# Patient Record
Sex: Female | Born: 1990 | Hispanic: No | Marital: Single | Smoking: Never smoker
Health system: Southern US, Community
[De-identification: ages and names within clinical notes are randomized; demographics above are authoritative.]

---

## 2016-11-10 ENCOUNTER — Encounter (HOSPITAL_COMMUNITY): Payer: Self-pay | Admitting: Emergency Medicine

## 2016-11-10 ENCOUNTER — Ambulatory Visit (HOSPITAL_COMMUNITY)
Admission: EM | Admit: 2016-11-10 | Discharge: 2016-11-10 | Disposition: A | Discharge status: Home / Self Care | Payer: PRIVATE HEALTH INSURANCE | Attending: Family Medicine | Admitting: Family Medicine

## 2016-11-10 DIAGNOSIS — M5432 Sciatica, left side: Secondary | ICD-10-CM | POA: Diagnosis not present

## 2016-11-10 MED ORDER — PREDNISONE 10 MG (21) PO TBPK: ORAL_TABLET | ORAL | 0 refills | Status: AC

## 2016-11-10 MED ORDER — CYCLOBENZAPRINE HCL 10 MG PO TABS: 10.0000 mg | ORAL_TABLET | Freq: Two times a day (BID) | ORAL | 0 refills | Status: AC | PRN

## 2016-11-10 NOTE — ED Triage Notes (Signed)
Pt here for left leg pain onset 6 days   Reports she heard a pop in her back when she was pulling a table at school  A&O x4... NAD... Ambulatory

## 2016-11-10 NOTE — ED Provider Notes (Signed)
  [facility]   [id number] 11/10/16 Arrival Time: 1229  ASSESSMENT & PLAN:  1. Sciatica of left side     Meds ordered this encounter  Medications  . predniSONE (STERAPRED UNI-PAK 21 TAB) 10 MG (21) TBPK tablet    Sig: Take 6-5-4-3-2-1 po qd    Dispense:  21 tablet    Refill:  0    Order Specific Question:   Supervising Provider    AnswerMardella Layman [[id number]]  . cyclobenzaprine (FLEXERIL) 10 MG tablet    Sig: Take 1 tablet (10 mg total) by mouth 2 (two) times daily as needed for muscle spasms.    Dispense:  20 tablet    Refill:  0    Order Specific Question:   Supervising Provider    Answer:   Mardella Layman [[id number]]    Reviewed expectations re: course of current medical issues. Questions answered. Outlined signs and symptoms indicating need for more acute intervention. Patient verbalized understanding. After Visit Summary given.   SUBJECTIVE:  Alexa Foster is a 26 y.o. female who presents with complaint of left back pain radiating down her left buttock to left knee.  ROS: As per HPI.   OBJECTIVE:  Vitals:   11/10/16 1309  BP: (!) 98/58  Pulse: 71  Resp: 16  Temp: 98.5 F (36.9 C)  TempSrc: Oral  SpO2: 100%     General appearance: alert; no distress Eyes: PERRLA; EOMI; conjunctiva normal HENT: normocephalic; atraumatic; TMs normal; nasal mucosa normal; oral mucosa normal Neck: supple Lungs: clear to auscultation bilaterally Heart: regular rate and rhythm Abdomen: soft, non-tender; bowel sounds normal; no masses or organomegaly; no guarding or rebound tenderness Back: no CVA tenderness Extremities: no cyanosis or edema; symmetrical with no gross deformities,  TTP left sciatic notch and neg SLR Skin: warm and dry Neurologic: normal gait; normal symmetric reflexes Psychological: alert and cooperative; normal mood and affect  History reviewed. No pertinent past medical history.   has no past medical history on file.  No results found  for this or any previous visit.  Labs Reviewed - No data to display  Imaging: No results found.  Allergies  Allergen Reactions  . Lactose Intolerance (Gi)     History reviewed. No pertinent family history. History reviewed. No pertinent surgical history.       Deatra Canter, FNP 11/10/16 1426

## 2017-07-09 ENCOUNTER — Other Ambulatory Visit: Payer: Self-pay

## 2017-07-09 ENCOUNTER — Emergency Department (HOSPITAL_COMMUNITY): Payer: PRIVATE HEALTH INSURANCE

## 2017-07-09 ENCOUNTER — Emergency Department (HOSPITAL_COMMUNITY)
Admission: EM | Admit: 2017-07-09 | Discharge: 2017-07-09 | Disposition: A | Discharge status: Home / Self Care | Payer: PRIVATE HEALTH INSURANCE | Attending: Emergency Medicine | Admitting: Emergency Medicine

## 2017-07-09 ENCOUNTER — Encounter (HOSPITAL_COMMUNITY): Payer: Self-pay | Admitting: Emergency Medicine

## 2017-07-09 DIAGNOSIS — R110 Nausea: Secondary | ICD-10-CM | POA: Diagnosis not present

## 2017-07-09 DIAGNOSIS — R1012 Left upper quadrant pain: Secondary | ICD-10-CM | POA: Diagnosis not present

## 2017-07-09 DIAGNOSIS — R140 Abdominal distension (gaseous): Secondary | ICD-10-CM | POA: Diagnosis not present

## 2017-07-09 DIAGNOSIS — R1013 Epigastric pain: Secondary | ICD-10-CM | POA: Insufficient documentation

## 2017-07-09 LAB — URINALYSIS, ROUTINE W REFLEX MICROSCOPIC
BACTERIA UA: NONE SEEN
Bilirubin Urine: NEGATIVE
Glucose, UA: NEGATIVE mg/dL
KETONES UR: 20 mg/dL — AB
Leukocytes, UA: NEGATIVE
Nitrite: NEGATIVE
PROTEIN: NEGATIVE mg/dL
Specific Gravity, Urine: 1.01 (ref 1.005–1.030)
pH: 5 (ref 5.0–8.0)

## 2017-07-09 LAB — COMPREHENSIVE METABOLIC PANEL
ALBUMIN: 4.1 g/dL (ref 3.5–5.0)
ALT: 9 U/L — ABNORMAL LOW (ref 14–54)
ANION GAP: 10 (ref 5–15)
AST: 15 U/L (ref 15–41)
Alkaline Phosphatase: 62 U/L (ref 38–126)
BUN: 6 mg/dL (ref 6–20)
CHLORIDE: 107 mmol/L (ref 101–111)
CO2: 23 mmol/L (ref 22–32)
Calcium: 8.9 mg/dL (ref 8.9–10.3)
Creatinine, Ser: 0.58 mg/dL (ref 0.44–1.00)
GFR calc Af Amer: >60 mL/min (ref >60)
GFR calc non Af Amer: >60 mL/min (ref >60)
GLUCOSE: 99 mg/dL (ref 65–99)
POTASSIUM: 3.4 mmol/L — AB (ref 3.5–5.1)
SODIUM: 140 mmol/L (ref 135–145)
Total Bilirubin: 0.4 mg/dL (ref 0.3–1.2)
Total Protein: 7.5 g/dL (ref 6.5–8.1)

## 2017-07-09 LAB — I-STAT BETA HCG BLOOD, ED (MC, WL, AP ONLY)

## 2017-07-09 LAB — CBC
HEMATOCRIT: 33.5 % — AB (ref 36.0–46.0)
HEMOGLOBIN: 10.7 g/dL — AB (ref 12.0–15.0)
MCH: 24.9 pg — AB (ref 26.0–34.0)
MCHC: 31.9 g/dL (ref 30.0–36.0)
MCV: 77.9 fL — AB (ref 78.0–100.0)
Platelets: 368 10*3/uL (ref 150–400)
RBC: 4.3 MIL/uL (ref 3.87–5.11)
RDW: 15.5 % (ref 11.5–15.5)
WBC: 6.7 10*3/uL (ref 4.0–10.5)

## 2017-07-09 LAB — LIPASE, BLOOD: Lipase: 31 U/L (ref 11–51)

## 2017-07-09 MED ORDER — ONDANSETRON 4 MG PO TBDP
4.0000 mg | ORAL_TABLET | Freq: Once | ORAL | Status: AC | PRN
  Administered 2017-07-09: 4 mg via ORAL
  Filled 2017-07-09: qty 1

## 2017-07-09 MED ORDER — GI COCKTAIL ~~LOC~~
30.0000 mL | Freq: Once | ORAL | Status: AC
  Administered 2017-07-09: 30 mL via ORAL
  Filled 2017-07-09: qty 30

## 2017-07-09 MED ORDER — OMEPRAZOLE 20 MG PO CPDR: 20.0000 mg | DELAYED_RELEASE_CAPSULE | Freq: Every day | ORAL | 0 refills | Status: AC

## 2017-07-09 NOTE — ED Notes (Addendum)
Pt verbalizes her stomach has been distended for over a month.  Verbalizes green diarrhea  Cramping pains of 8 out 10  Nausea  Verbalizes burning when pee sometimes  Back pain  and chills  Started her menstrual cycle today   Pt took pepto and a stomach pill that " starts with a A" .

## 2017-07-09 NOTE — ED Triage Notes (Signed)
Pt reports having abdominal pain since Friday and has been having nausea but no vomiting

## 2017-07-09 NOTE — ED Provider Notes (Signed)
[facility]-EMERGENCY DEPT Provider Note   CSN: [id number] Arrival date & time: 07/09/17  0022     History   Chief Complaint Chief Complaint  Patient presents with  . Abdominal Pain    HPI Alexa Foster is a 27 y.o. female.  HPI  27 year old female comes in with chief complaint of abdominal pain. Patient is from [place].  She states that she has been having pain for the last 3 or 4 months.  Pain is located in the epigastric and the left upper quadrant.  Pain is intermittent, however when it comes it stays for at least a week.  Patient has taken away gluten from her diet and she continues to have pain.  No history of similar episodes in the past.  Patient has had nausea without emesis and she denies any diarrhea.  Patient is not pregnant and denies any UTI-like symptoms.  History reviewed. No pertinent past medical history.  There are no active problems to display for this patient.   History reviewed. No pertinent surgical history.   OB History   None      Home Medications    Prior to Admission medications   Medication Sig Start Date End Date Taking? Authorizing Provider  bismuth subsalicylate (PEPTO BISMOL) 262 MG chewable tablet Chew 524 mg by mouth 3 (three) times daily as needed for indigestion.   Yes [provider]  calcium carbonate (TUMS - DOSED IN MG ELEMENTAL CALCIUM) 500 MG chewable tablet Chew 1 tablet by mouth 3 (three) times daily as needed for indigestion or heartburn.   Yes [provider]  cyclobenzaprine (FLEXERIL) 10 MG tablet Take 1 tablet (10 mg total) by mouth 2 (two) times daily as needed for muscle spasms. Patient not taking: Reported on 07/09/2017 11/10/16   Deatra Canter, FNP  predniSONE (STERAPRED UNI-PAK 21 TAB) 10 MG (21) TBPK tablet Take 6-5-4-3-2-1 po qd Patient not taking: Reported on 07/09/2017 11/10/16   Deatra Canter, FNP    Family History History reviewed. No pertinent family  history.  Social History Social History   Tobacco Use  . Smoking status: Never Smoker  . Smokeless tobacco: Never Used  Substance Use Topics  . Alcohol use: No  . Drug use: No     Allergies   Lactose intolerance (gi)   Review of Systems Review of Systems  Constitutional: Positive for activity change.  Respiratory: Negative for shortness of breath.   Cardiovascular: Negative for chest pain.  Gastrointestinal: Positive for abdominal pain and nausea.  Genitourinary: Negative for dysuria and flank pain.  Hematological: Does not bruise/bleed easily.  All other systems reviewed and are negative.    Physical Exam Updated Vital Signs BP 114/74 (BP Location: Left Arm)   Pulse 82   Temp 97.9 F (36.6 C) (Oral)   Resp 14   Ht 5' 2.6" (1.59 m)   Wt 60 kg (132 lb 4.4 oz)   LMP 07/09/2017 (Exact Date)   SpO2 100%   BMI 23.73 kg/m   Physical Exam  Constitutional: She is oriented to person, place, and time. She appears well-developed.  HENT:  Head: Normocephalic and atraumatic.  Eyes: EOM are normal.  Neck: Normal range of motion. Neck supple.  Cardiovascular: Normal rate.  Pulmonary/Chest: Effort normal.  Abdominal: Bowel sounds are normal. There is tenderness in the epigastric area and left upper quadrant.  Neurological: She is alert and oriented to person, place, and time.  Skin: Skin is warm and dry.  Nursing note  and vitals reviewed.    ED Treatments / Results  Labs (all labs ordered are listed, but only abnormal results are displayed) Labs Reviewed  COMPREHENSIVE METABOLIC PANEL - Abnormal; Notable for the following components:      Result Value   Potassium 3.4 (*)    ALT 9 (*)    All other components within normal limits  CBC - Abnormal; Notable for the following components:   Hemoglobin 10.7 (*)    HCT 33.5 (*)    MCV 77.9 (*)    MCH 24.9 (*)    All other components within normal limits  URINALYSIS, ROUTINE W REFLEX MICROSCOPIC - Abnormal; Notable for  the following components:   Hgb urine dipstick MODERATE (*)    Ketones, ur 20 (*)    All other components within normal limits  LIPASE, BLOOD  I-STAT BETA HCG BLOOD, ED ([facility], [facility], [facility] ONLY)    EKG None  Radiology No results found.  Procedures Procedures (including critical care time)  Medications Ordered in ED Medications  ondansetron (ZOFRAN-ODT) disintegrating tablet 4 mg (has no administration in time range)  gi cocktail (Maalox,Lidocaine,Donnatal) (has no administration in time range)     Initial Impression / Assessment and Plan / ED Course  I have reviewed the triage vital signs and the nursing notes.  Pertinent labs & imaging results that were available during my care of the patient were reviewed by me and considered in my medical decision making (see chart for details).     27 year old female comes in with chief complaint of epigastric abdominal pain and bloating.   DDx includes: Pancreatitis Hepatobiliary pathology including cholecystitis Gastritis/PUD SBO Splenomegaly  Patient comes in a chief complaint of abdominal discomfort that is intermittent.  Patient feels like her abdomen is swollen-edematous.  On my exam she is noted to have left upper quadrant and epigastric tenderness, there is some guarding.  Labs are reassuring and I do not think there is any acute surgical pathology going on.  Patient is a foreign student, we will get ultrasound to rule out splenomegaly.  If the ultrasound is normal then she will follow-up with GI if the ultrasound is abnormal then she will need to follow-up with heme-onc.  Final Clinical Impressions(s) / ED Diagnoses   Final diagnoses:  Epigastric abdominal pain    ED Discharge Orders    None       Derwood KaplanNanavati, Binh Doten, MD 07/09/17 [phone]

## 2017-07-09 NOTE — Discharge Instructions (Addendum)
We saw you in the ER for the abdominal pain.  The lab workup and the ultrasound in the ER normal. We are not sure what is causing the pain. We suspect that this could be gastritis, and giving appropriate medications for that. Please see the GI doctor by calling and setting up an appointment as soon as possible for further workup.

## 2019-05-05 IMAGING — US US ABDOMEN COMPLETE
1 series · 14 of 25 positions shown · non-contrast
Comparison: None.

CLINICAL DATA: Left upper quadrant abdominal pain

EXAM:
ABDOMEN ULTRASOUND COMPLETE

[Series 1: us abdomen complete · 0.17mm/px · 14 of 84 slices shown]
[im 1/84]
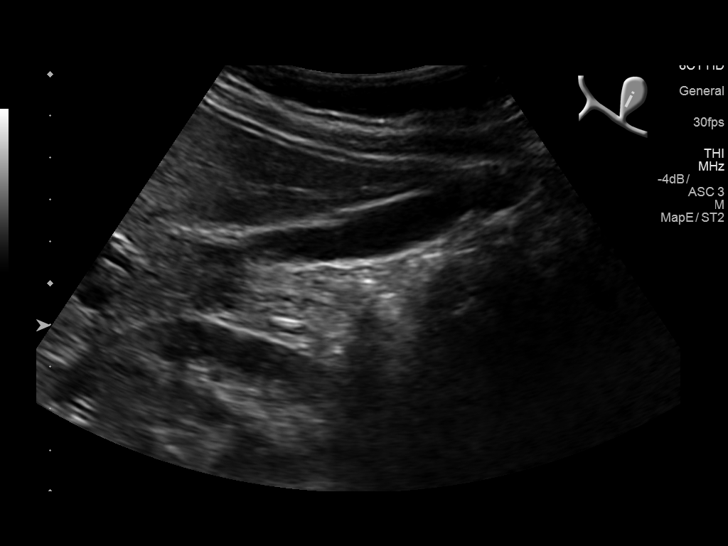
[im 7/84]
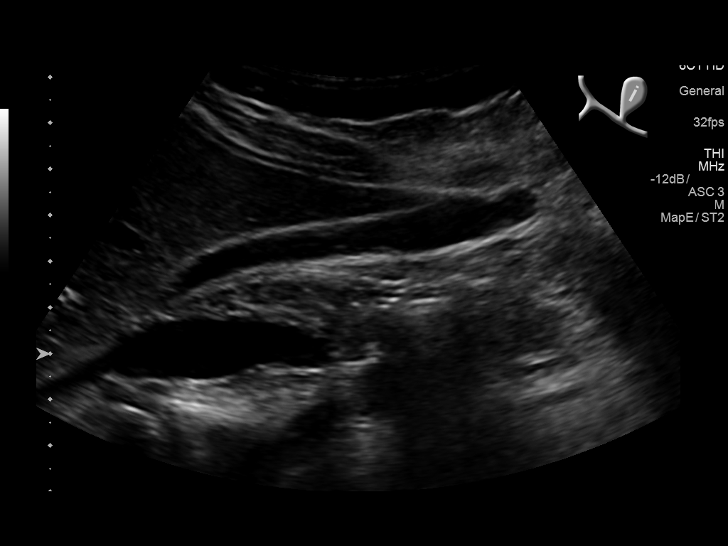
[im 14/84]
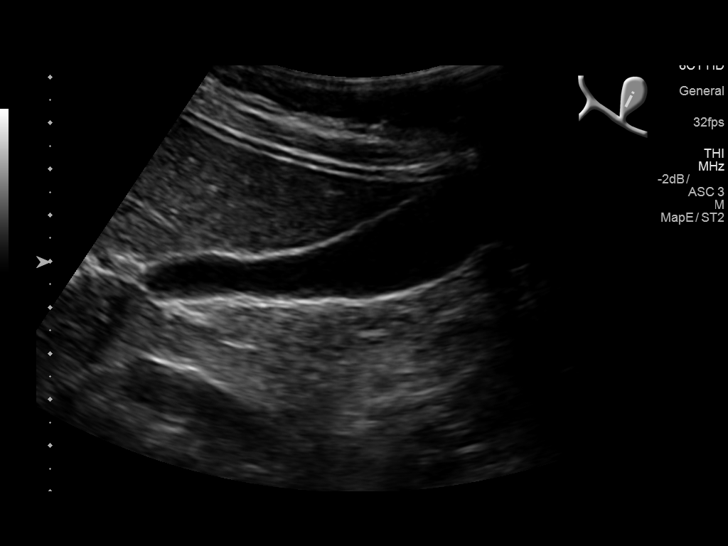
[im 21/84]
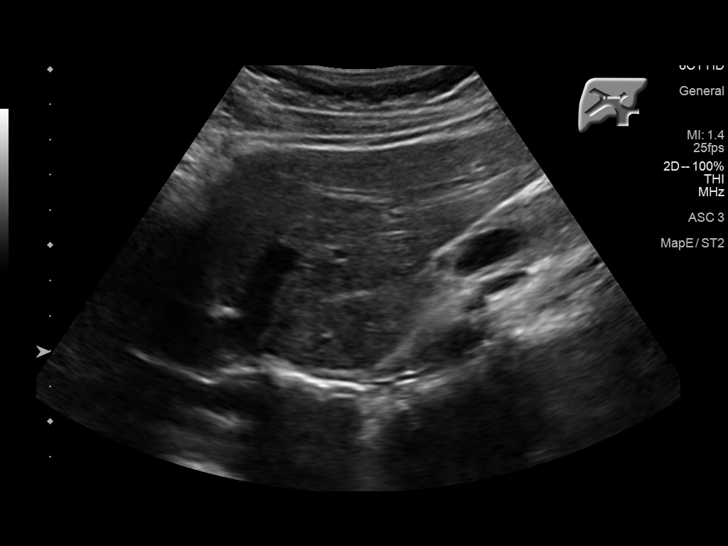
[im 28/84]
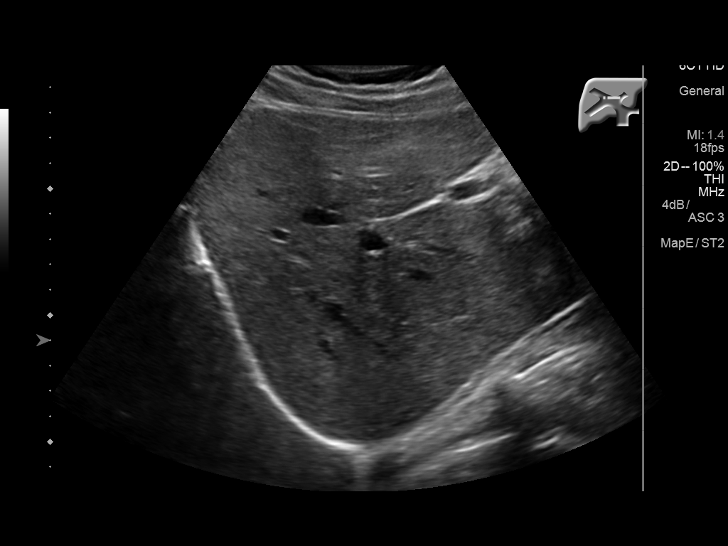
[im 32/84]
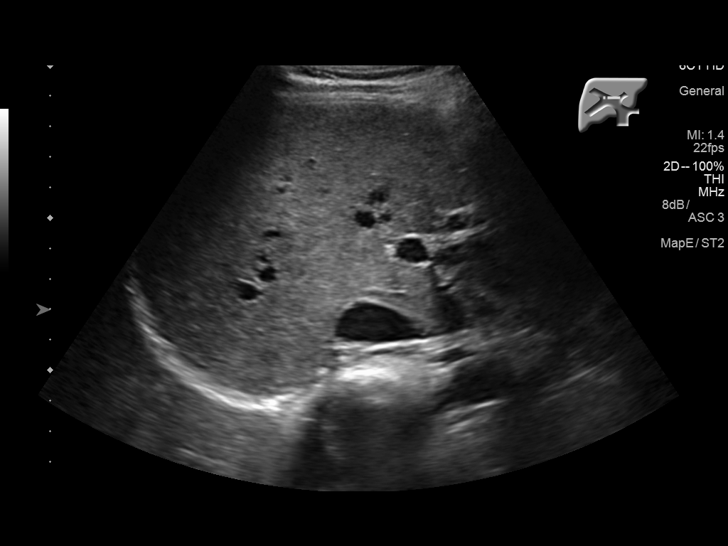
[im 39/84]
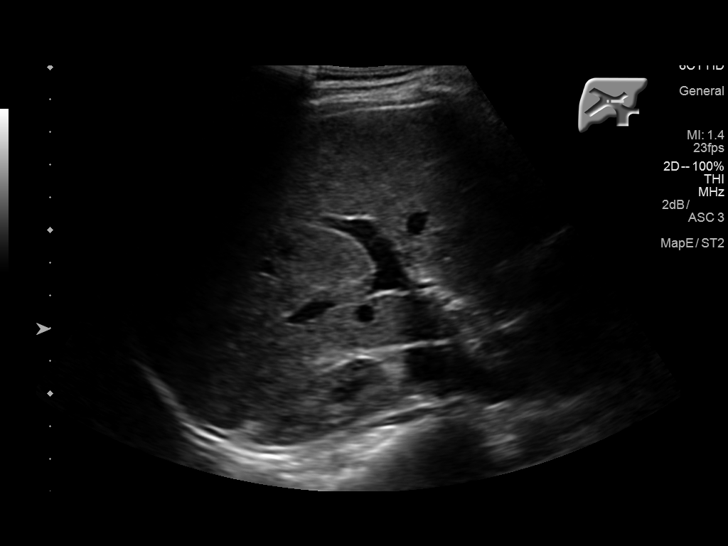
[im 45/84]
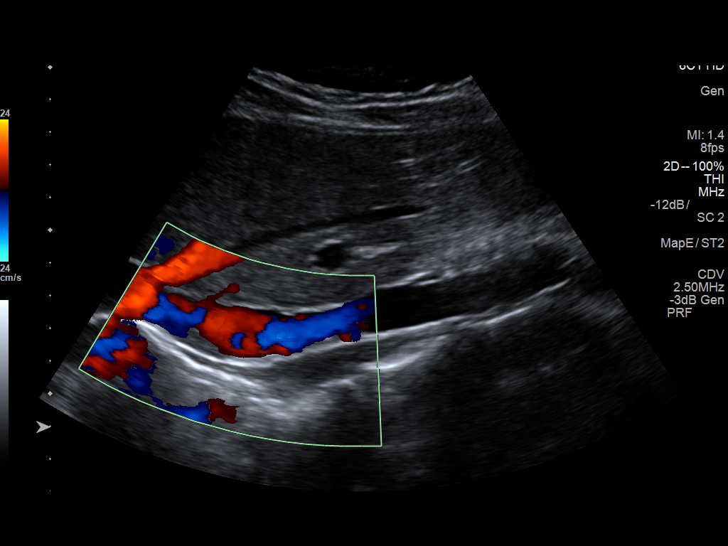
[im 52/84]
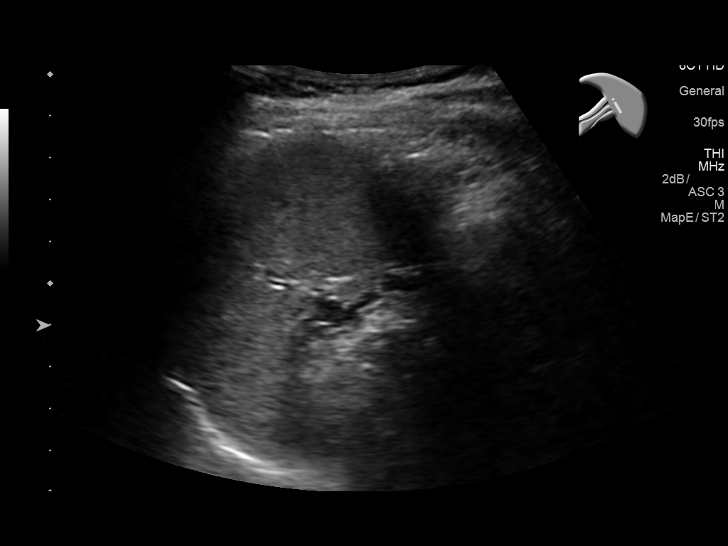
[im 56/84]
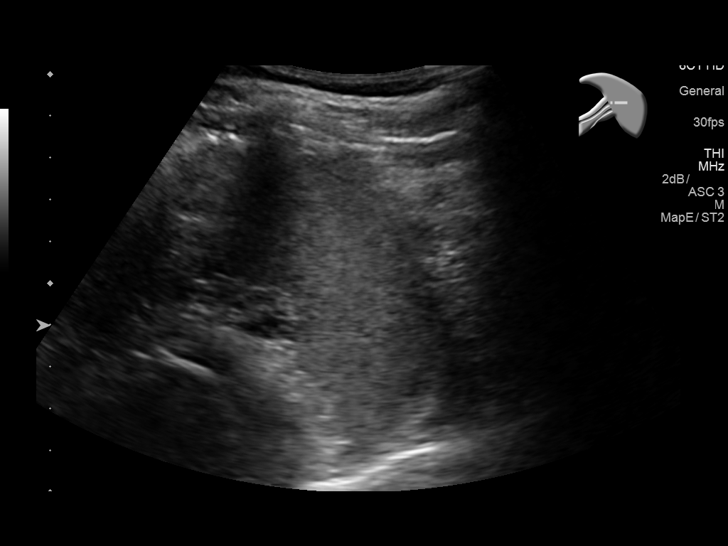
[im 63/84]
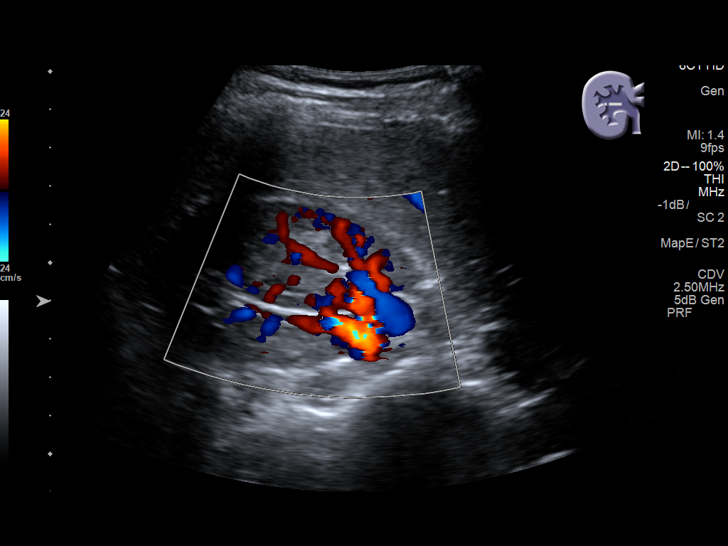
[im 70/84]
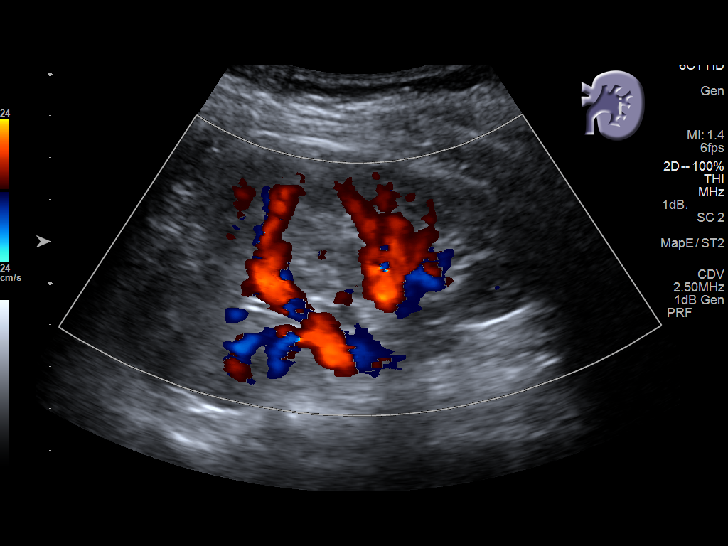
[im 77/84]
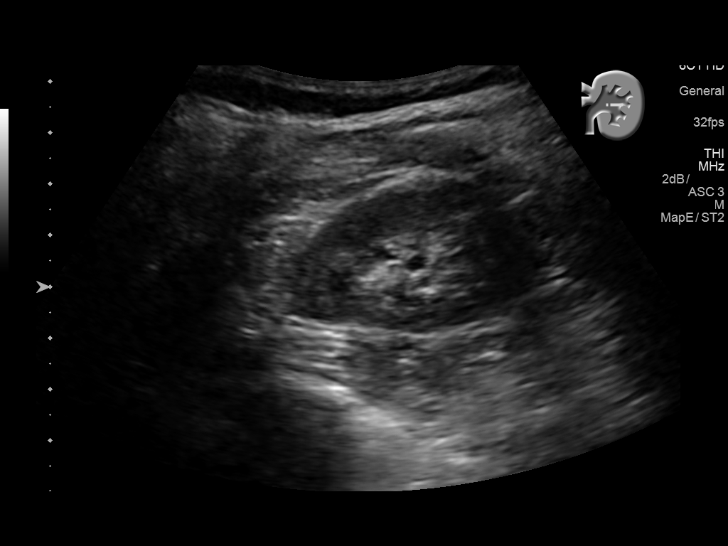
[im 84/84]
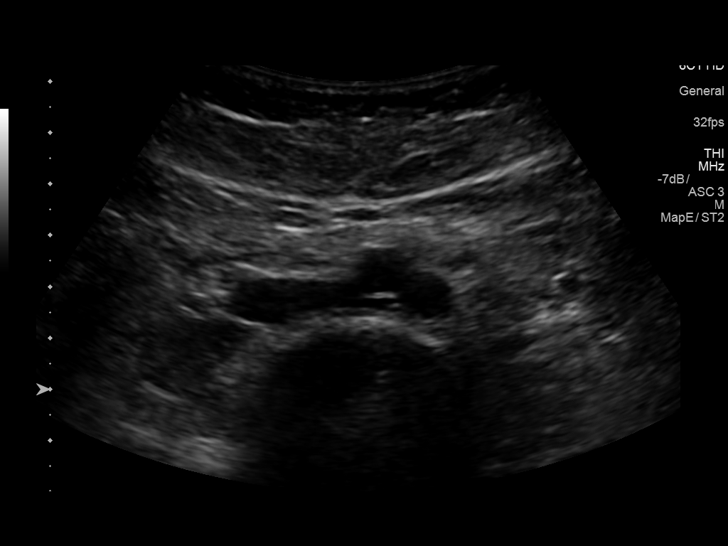

[14 of 25 positions shown; findings below may reference images not displayed]

FINDINGS: Gallbladder: No gallstones or wall thickening visualized. No
sonographic Murphy sign noted by sonographer.

Common bile duct: Diameter: Normal caliber, 2 mm

Liver: No focal lesion identified. Within normal limits in
parenchymal echogenicity. Portal vein is patent on color Doppler
imaging with normal direction of blood flow towards the liver.

IVC: No abnormality visualized.

Pancreas: Visualized portion unremarkable.

Spleen: Size and appearance within normal limits.

Right Kidney: Length: 10.2 cm. Echogenicity within normal limits. No
mass or hydronephrosis visualized.

Left Kidney: Length: 10.5 cm. Echogenicity within normal limits. No
mass or hydronephrosis visualized.

Abdominal aorta: No aneurysm visualized.

Other findings: None.
IMPRESSION: Unremarkable abdominal ultrasound.
